# Patient Record
Sex: Female | Born: 2005 | Race: Black or African American | Hispanic: No | Marital: Single | Smoking: Never smoker
Health system: Southern US, Community
[De-identification: ages and names within clinical notes are randomized; demographics above are authoritative.]

---
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
## 2023-01-02 MED ORDER — OLOPATADINE HCL 0.1 % OP SOLN: 1.0000 [drp] | Freq: Two times a day (BID) | OPHTHALMIC | 12 refills | Status: DC

## 2023-01-02 MED ORDER — OLOPATADINE HCL 0.1 % OP SOLN: 1.0000 [drp] | Freq: Two times a day (BID) | OPHTHALMIC | Status: DC

## 2023-01-02 NOTE — Addendum Note (Signed)
Addended by: Vanna Scotland on: 01/02/2023 10:33 AM   Modules accepted: Orders

## 2023-01-20 ENCOUNTER — Ambulatory Visit (HOSPITAL_COMMUNITY)
Admission: EM | Admit: 2023-01-20 | Discharge: 2023-01-20 | Disposition: A | Discharge status: Home / Self Care | Payer: Medicaid Other | Attending: Family Medicine | Admitting: Family Medicine

## 2023-01-20 ENCOUNTER — Encounter (HOSPITAL_COMMUNITY): Payer: Self-pay | Admitting: *Deleted

## 2023-01-20 ENCOUNTER — Other Ambulatory Visit: Payer: Self-pay

## 2023-01-20 DIAGNOSIS — L309 Dermatitis, unspecified: Secondary | ICD-10-CM | POA: Diagnosis not present

## 2023-01-20 DIAGNOSIS — H1033 Unspecified acute conjunctivitis, bilateral: Secondary | ICD-10-CM | POA: Diagnosis not present

## 2023-01-20 MED ORDER — PREDNISONE 20 MG PO TABS: 40.0000 mg | ORAL_TABLET | Freq: Every day | ORAL | 0 refills | Status: AC

## 2023-01-20 MED ORDER — GENTAMICIN SULFATE 0.3 % OP SOLN: 2.0000 [drp] | Freq: Three times a day (TID) | OPHTHALMIC | 0 refills | Status: AC

## 2023-01-20 NOTE — Discharge Instructions (Addendum)
Take prednisone 20 mg--2 daily for 5 days; this is for the dermatitis around your eyes.  Put gentamicin eyedrops in the affected eye(s) 3 times daily for 5 days.  Please also follow-up with your primary care about this issue.  If not improving you may need to see an eye specialist

## 2023-01-20 NOTE — ED Triage Notes (Signed)
Pt reports Rt eye burns . Pt was seen 2 weeks ago by PCP. Pt has been taking eye dropps given by PCP with out relief of Sx's.

## 2023-01-20 NOTE — ED Provider Notes (Signed)
[facility]    CSN: [id number] Arrival date & time: 01/20/23  0930      History   Chief Complaint Chief Complaint  Patient presents with   Eye Pain    HPI Alyssa Hamilton is a 17 y.o. female.    Eye Pain  Here for bilateral eye burning/irritation, and also rash around her eyes.  She was seen by her primary care about 2 weeks ago and treatment was done with ocular antihistamine.  She states today that that is not helping.  She is having discharge in the eyes.  The rash is itching    History reviewed. No pertinent past medical history.  Patient Active Problem List   Diagnosis Date Noted   Adjustment disorder with mixed anxiety and depressed mood 03/10/2021   Abnormal bleeding in menstrual cycle 03/10/2021   Asymptomatic microscopic hematuria 08/13/2018   Chronic seasonal allergic rhinitis 03/31/2016   Eczema 04/29/2015    History reviewed. No pertinent surgical history.  OB History   No obstetric history on file.      Home Medications    Prior to Admission medications   Medication Sig Start Date End Date Taking? Authorizing Provider  gentamicin (GARAMYCIN) 0.3 % ophthalmic solution Place 2 drops into both eyes 3 (three) times daily for 5 days. 01/20/23 01/25/23 Yes Braylie Badami, Janace Aris, MD  predniSONE (DELTASONE) 20 MG tablet Take 2 tablets (40 mg total) by mouth daily with breakfast for 5 days. 01/20/23 01/25/23 Yes Zenia Resides, MD  FLUoxetine (PROZAC) 20 MG/5ML solution Take 2.5 mLs (10 mg total) by mouth daily. Patient not taking: Reported on 08/12/2022 06/22/22   Kalman Jewels, MD  fluticasone [facility]) 50 MCG/ACT nasal spray Place 1-2 sprays into both nostrils daily. 1 spray in each nostril every day Patient not taking: Reported on 08/12/2022 06/10/22   Ettefagh, Aron Baba, MD    Family History History reviewed. No pertinent family history.  Social History Social History   Tobacco Use   Smoking status: Never   Smokeless  tobacco: Never  Substance Use Topics   Alcohol use: Never   Drug use: Never     Allergies   Patient has no known allergies.   Review of Systems Review of Systems  Eyes:  Positive for pain.     Physical Exam Triage Vital Signs ED Triage Vitals  Encounter Vitals Group     BP 01/20/23 1019 116/80     Systolic BP Percentile --      Diastolic BP Percentile --      Pulse Rate 01/20/23 1019 88     Resp 01/20/23 1019 18     Temp 01/20/23 1019 98 F (36.7 C)     Temp src --      SpO2 01/20/23 1019 98 %     Weight --      Height --      Head Circumference --      Peak Flow --      Pain Score 01/20/23 1016 3     Pain Loc --      Pain Education --      Exclude from Growth Chart --    No data found.  Updated Vital Signs BP 116/80   Pulse 88   Temp 98 F (36.7 C)   Resp 18   SpO2 98%   Visual Acuity Right Eye Distance:   Left Eye Distance:   Bilateral Distance:    Right Eye Near:   Left Eye Near:  Bilateral Near:     Physical Exam Vitals reviewed.  Constitutional:      General: She is not in acute distress.    Appearance: She is not ill-appearing, toxic-appearing or diaphoretic.  HENT:     Mouth/Throat:     Mouth: Mucous membranes are moist.  Eyes:     Extraocular Movements: Extraocular movements intact.     Pupils: Pupils are equal, round, and reactive to light.     Comments: There is bilateral injection of the conjunctiva.  She also has mildly erythematous bumpy rash on her lower eyelids and a little bit on her upper eyelids.  No swelling of the eyelids.  Musculoskeletal:     Cervical back: Neck supple.  Lymphadenopathy:     Cervical: No cervical adenopathy.  Skin:    Coloration: Skin is not pale.  Neurological:     General: No focal deficit present.     Mental Status: She is alert and oriented to person, place, and time.  Psychiatric:        Behavior: Behavior normal.      UC Treatments / Results  Labs (all labs ordered are listed, but  only abnormal results are displayed) Labs Reviewed - No data to display  EKG   Radiology No results found.  Procedures Procedures (including critical care time)  Medications Ordered in UC Medications - No data to display  Initial Impression / Assessment and Plan / UC Course  I have reviewed the triage vital signs and the nursing notes.  Pertinent labs & imaging results that were available during my care of the patient were reviewed by me and considered in my medical decision making (see chart for details).      Final Clinical Impressions(s) / UC Diagnoses   Final diagnoses:  Acute conjunctivitis of both eyes, unspecified acute conjunctivitis type  Dermatitis     Discharge Instructions      Take prednisone 20 mg--2 daily for 5 days  Put gentamicin eyedrops in the affected eye(s) 3 times daily for 5 days.  Please also follow-up with your primary care about this issue.  If not improving you may need to see an eye specialist     ED Prescriptions     Medication Sig Dispense Auth. Provider   gentamicin (GARAMYCIN) 0.3 % ophthalmic solution Place 2 drops into both eyes 3 (three) times daily for 5 days. 5 mL Zenia Resides, MD   predniSONE (DELTASONE) 20 MG tablet Take 2 tablets (40 mg total) by mouth daily with breakfast for 5 days. 10 tablet Marlinda Mike Janace Aris, MD      PDMP not reviewed this encounter.   Zenia Resides, MD 01/20/23 1045

## 2023-07-26 ENCOUNTER — Ambulatory Visit: Payer: Self-pay

## 2023-07-26 NOTE — Telephone Encounter (Signed)
 Copied from CRM [id number]. Topic: Clinical - Red Word Triage >> Jul 26, 2023 10:39 AM Donald Frost wrote: Red Word that prompted transfer to Nurse Triage: The patient called in to try to get established but she had been having urinary and menstrual cycle issues. She states she has been having urgency and frequency issues and bleeding very heavy on first day of cycle and then that is it. She is concerned and I will transfer her to [employer] [facility]  Chief Complaint: Establish care Symptoms: Urinary symptoms, abnormal menstrual cycle Frequency: 2 weeks  Pertinent Negatives: Patient denies fever Disposition: [] ED /[x] Urgent Care (no appt availability in office) / [x] Appointment(In office/virtual)/ []  [facility]/ [] Home Care/ [] Refused Recommended Disposition /[] [place] [facility]/ []  Follow-up with PCP Additional Notes: Patient called in looking to establish care at [facility]. Patient also reported that she has been having urinary symptoms and abnormal menstrual cycles. Patient stated she has been experiencing urinary frequency and urgency for the past 2 weeks. Patient stated upper abdominal pain is also present. Patient denied lower back pain, painful urination, fever and hematuria. Patient stated her menstrual cycles have lasted for about a day for the past year. This RN scheduled patient a NP appointment for next week. This RN advised patient to go to UC today for urinary symptoms. Patient verbalized understanding. Provided care advice and instructed patient to call back if symptoms worsen. Patient complied.   Reason for Disposition  Urinating more frequently than usual (i.e., frequency)  Answer Assessment - Initial Assessment Questions 1. SYMPTOM: "What's the main symptom you're concerned about?" (e.g., frequency, incontinence)     Urinary frequency 2. ONSET: "When did the urinary symptoms start?"     2 weeks ago 3. PAIN: "Is there any pain?" If Yes, ask: "How bad is it?" (Scale: 1-10; mild,  moderate, severe)     Upper abdominal pain, denies lower back pain 4. CAUSE: "What do you think is causing the symptoms?"     Unsure 5. OTHER SYMPTOMS: "Do you have any other symptoms?" (e.g., blood in urine, fever, flank pain, pain with urination)     Spotting last week, denies painful urination, states she feels like she is not emptying bladder completely, denies hematuria  Protocols used: Urinary Symptoms-A-AH

## 2023-07-27 NOTE — Telephone Encounter (Signed)
 Called pt she was agreeable to go to urgent care I advised her to make an appt for her other issue

## 2023-08-03 ENCOUNTER — Ambulatory Visit

## 2023-11-28 ENCOUNTER — Ambulatory Visit: Payer: Self-pay

## 2023-11-28 NOTE — Telephone Encounter (Signed)
 FYI Only or Action Required?: FYI only for provider.  Patient was last seen in primary care on 12/23/2022 by Kasey Gamma, MD.  Called Nurse Triage reporting Vaginal Bleeding.  Symptoms began several days ago.  Interventions attempted: Nothing.  Symptoms are: unchanged.  Triage Disposition: See PCP Within 2 Weeks  Patient/caregiver understands and will follow disposition?: Yes        Copied from CRM [id number]. Topic: Clinical - Red Word Triage >> Nov 28, 2023  1:45 PM Mia F wrote: Red Word that prompted transfer to Nurse Triage: Severe cramping and prolonged bleeding. Pt says she is not used to this bleeding nor cramping. /has been going on for a year but is worsening.   FO Reason for Disposition  Heavier than normal periods (e.g., doubling up on pads to prevent leaking, needing to change pads overnight, unable to do normal activities)    Pt calling to establish care with [facility], but no access available within dispo timeframe. Pt agreeable to expanded search-- scheduled with [facility].  Of note, triage was extremely limited d/t pt being at work and excess background noise.  Answer Assessment - Initial Assessment Questions 1. BLEEDING SEVERITY: Describe the bleeding that you are having. How much bleeding is there?      Currently on 4th tampon 2. ONSET: When did the bleeding begin? Is it continuing now?     X 3 days 3. MENSTRUAL PERIOD: When was the last normal menstrual period? How is this different than your period?     Last month -- lasted only 1 day 4. REGULARITY: How regular are your periods?     irregular 5. ABDOMEN PAIN: Do you have any pain? How bad is the pain?  (e.g., Scale 0-10; none, mild, moderate, or severe)     Endorses cramping 8/10 6. PREGNANCY: Is there any chance you are pregnant? When was your last menstrual period?     denies 7. BREASTFEEDING: Are you breastfeeding?     N/a 8. HORMONE MEDICINES: Are you taking any hormone  medicines, prescription or over-the-counter? (e.g., birth control pills, estrogen)     denies 9. BLOOD THINNER MEDICINES: Do you take any blood thinners? (e.g., Coumadin / warfarin, Pradaxa / dabigatran, aspirin)     denies 10. CAUSE: What do you think is causing the bleeding? (e.g., recent gyn surgery, recent gyn procedure; known bleeding disorder, cervical cancer, polycystic ovarian disease, fibroids)         cycle 11. HEMODYNAMIC STATUS: Are you weak or feeling lightheaded? If Yes, ask: Can you stand and walk normally?        Occasionally weak 12. OTHER SYMPTOMS: What other symptoms are you having with the bleeding? (e.g., passed tissue, vaginal discharge, fever, menstrual-type cramps)       Cramps, nausea.  Protocols used: Vaginal Bleeding - Abnormal-A-AH

## 2023-11-29 ENCOUNTER — Ambulatory Visit: Admitting: Family Medicine

## 2023-12-06 ENCOUNTER — Ambulatory Visit: Admitting: Family Medicine
